# Patient Record
Sex: Female | Born: 1965 | Race: White | Hispanic: No | Marital: Married | Smoking: Never smoker
Health system: Southern US, Community
[De-identification: ages and names within clinical notes are randomized; demographics above are authoritative.]

## PROBLEM LIST (undated history)

## (undated) HISTORY — PX: OTHER SURGICAL HISTORY: SHX169

---

## 2001-08-02 ENCOUNTER — Emergency Department (HOSPITAL_COMMUNITY): Admission: EM | Admit: 2001-08-02 | Discharge: 2001-08-02 | Payer: Self-pay | Admitting: Emergency Medicine

## 2001-08-02 ENCOUNTER — Encounter: Payer: Self-pay | Admitting: Emergency Medicine

## 2008-09-27 ENCOUNTER — Ambulatory Visit (HOSPITAL_COMMUNITY): Admission: RE | Admit: 2008-09-27 | Discharge: 2008-09-27 | Payer: Self-pay | Admitting: Dermatology

## 2010-01-13 IMAGING — CR DG CHEST 2V
2 series · 2 of 2 positions shown · non-contrast
Comparison: None

CLINICAL DATA: Evaluate for sarcoid.

CHEST - 2 VIEW

[w chest pa]
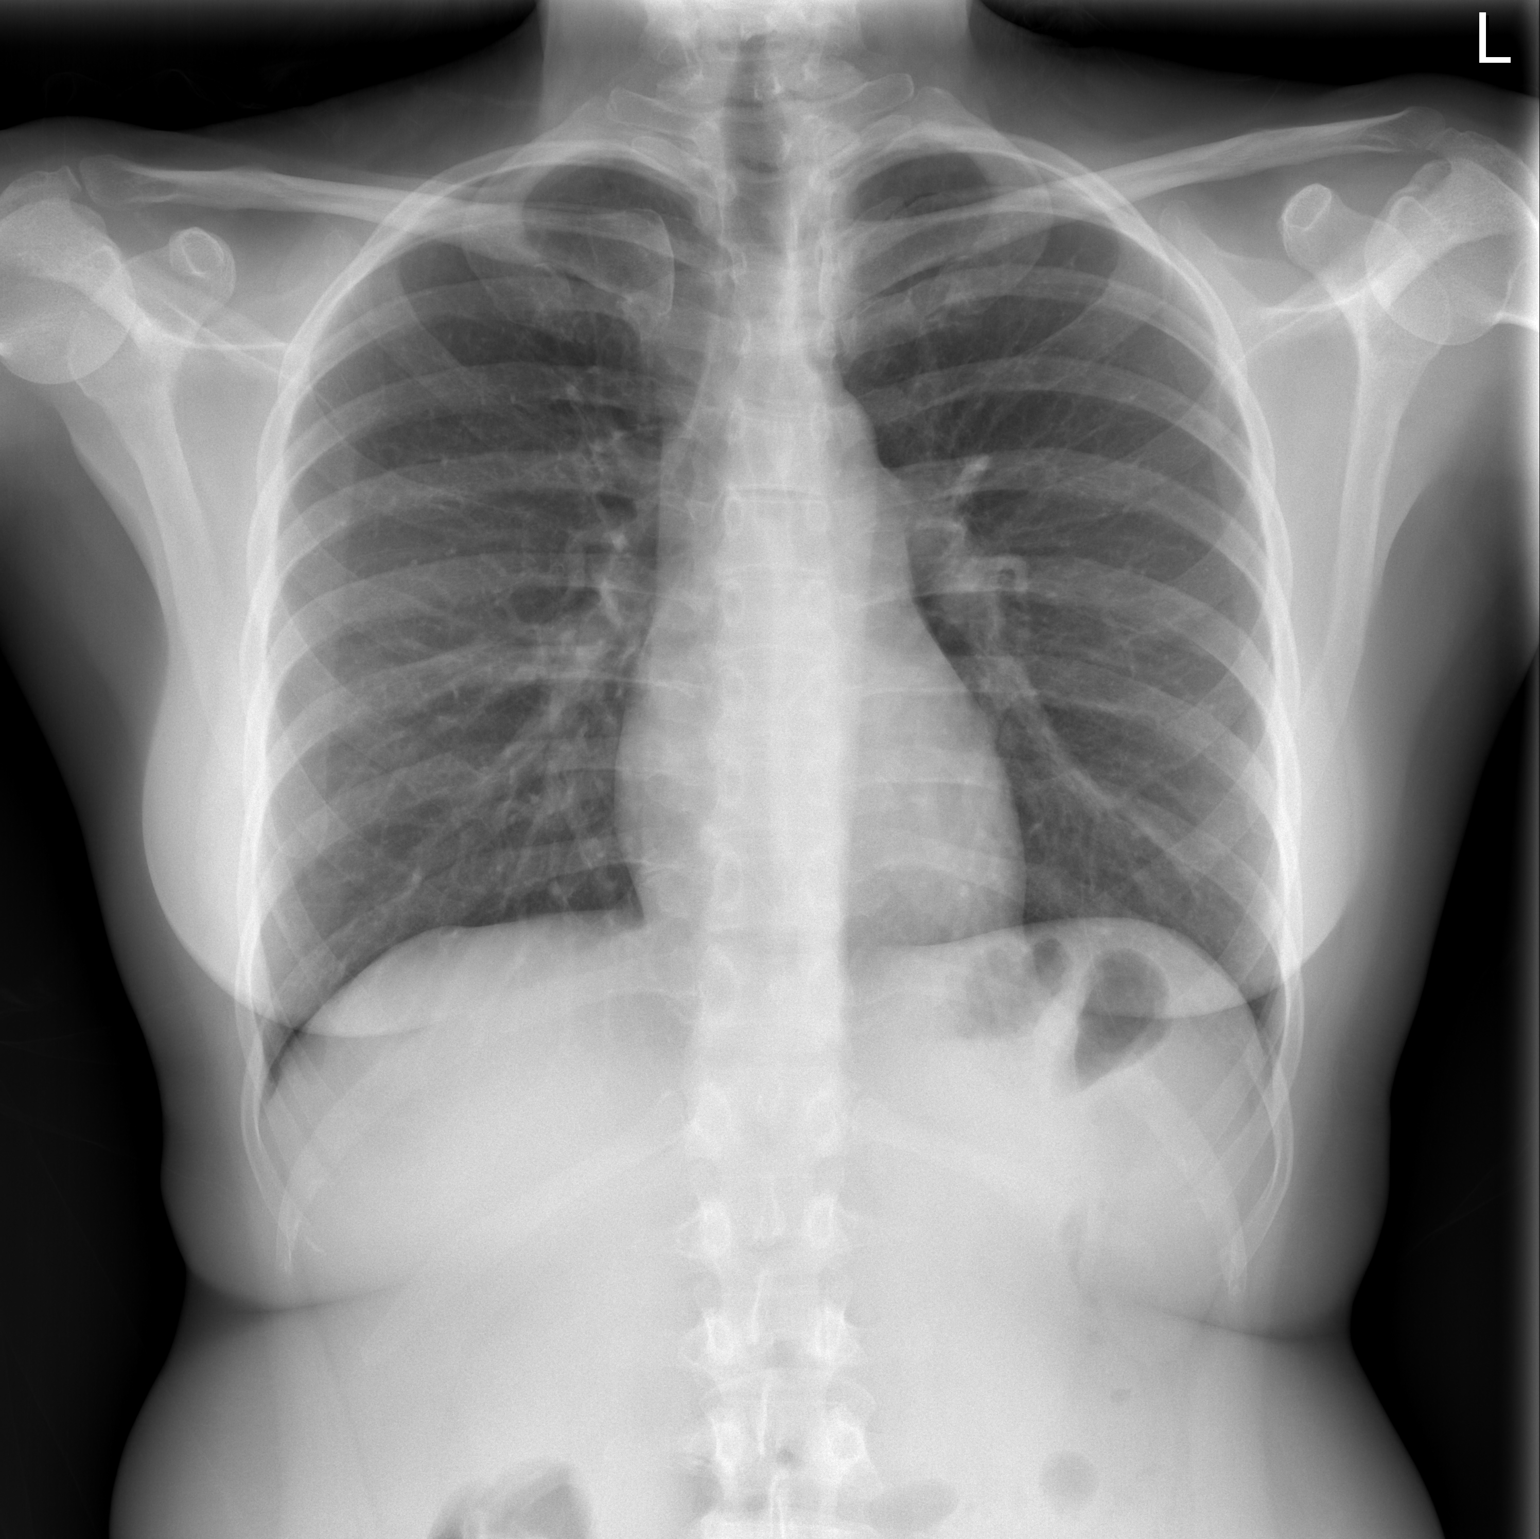

[w chest lat]
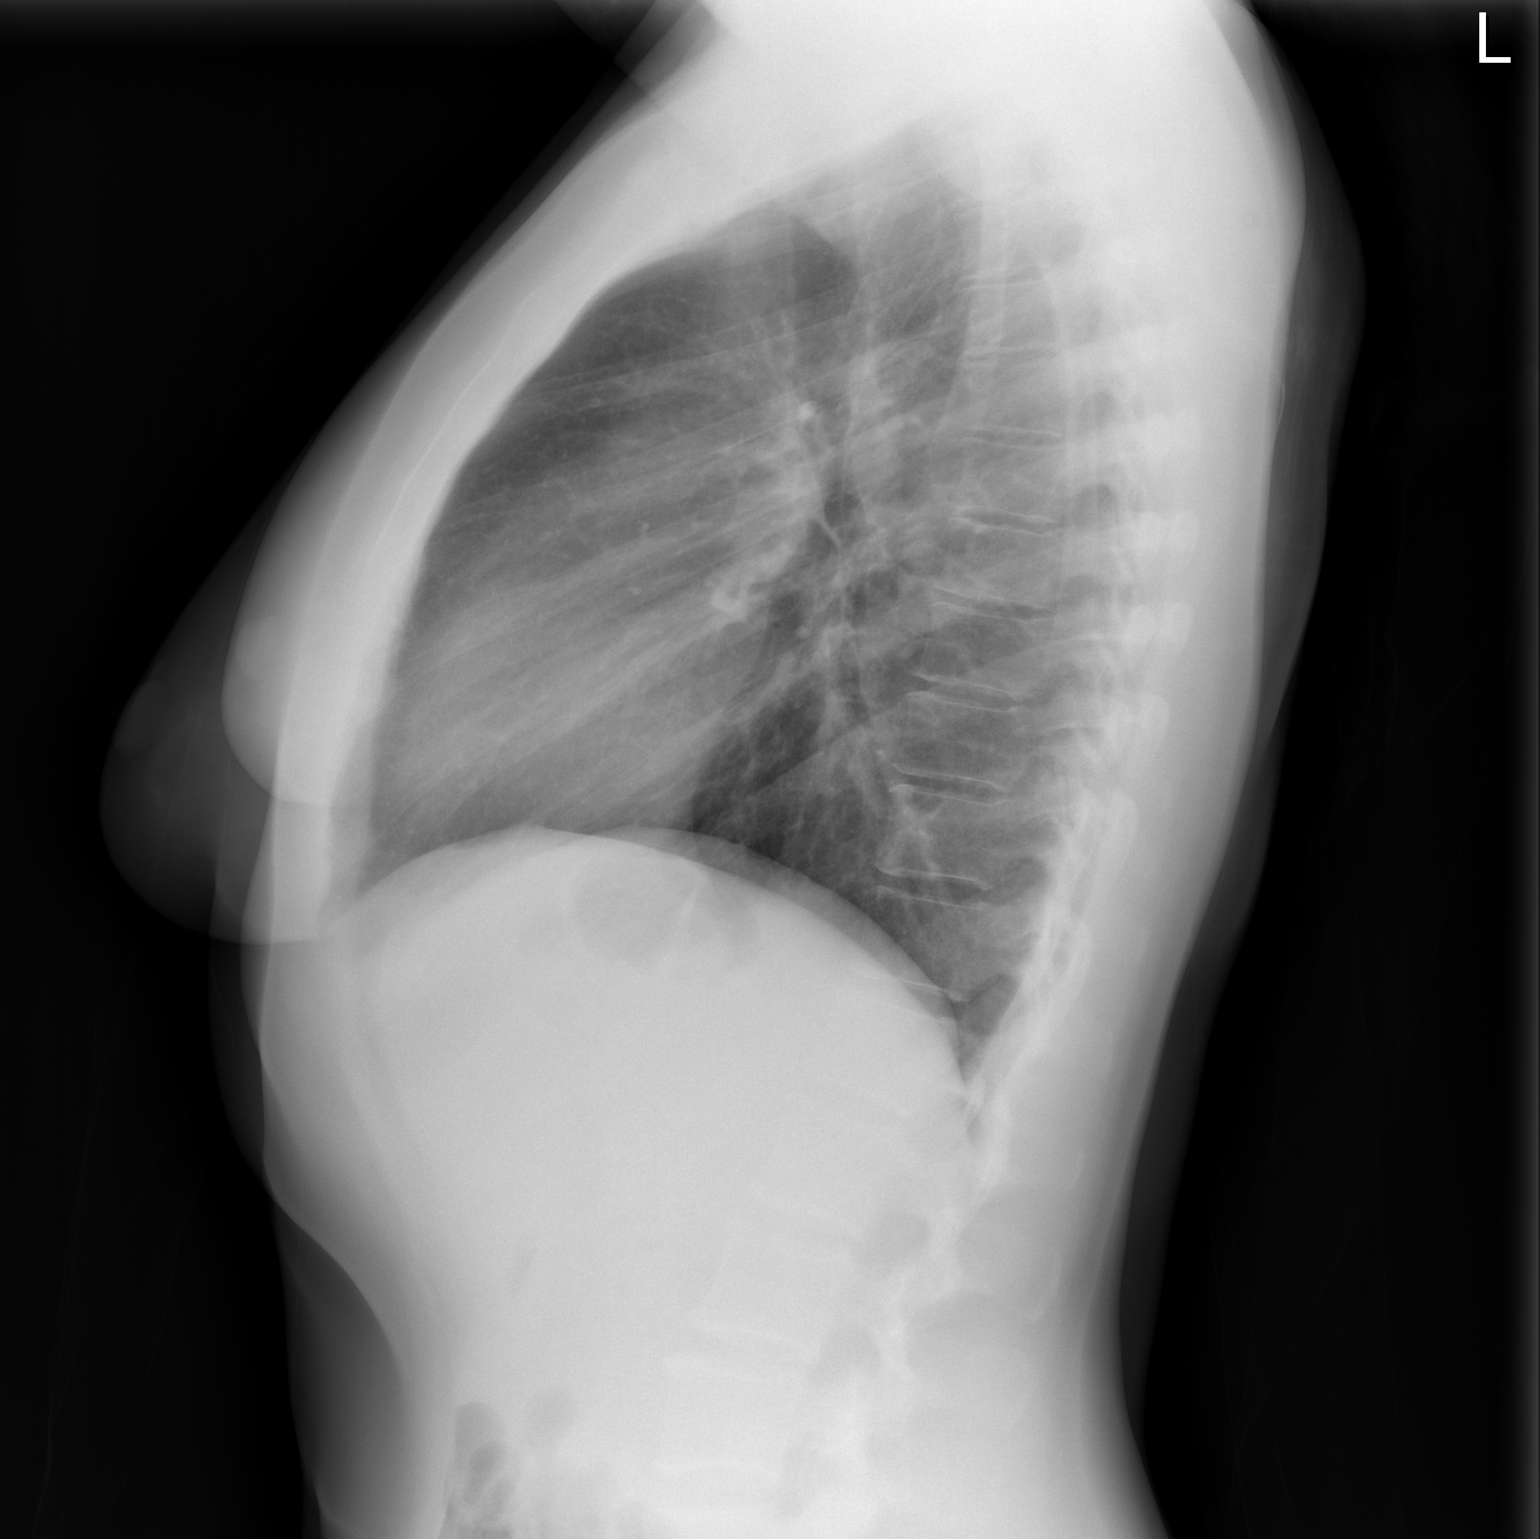

[2 of 2 positions shown; findings below may reference images not displayed]

FINDINGS: Heart and mediastinal contours are within normal limits.
No focal opacities or effusions.  No acute bony abnormality. No
evidence for mediastinal or hilar adenopathy.
IMPRESSION: No acute findings.

## 2010-09-20 NOTE — Consult Note (Signed)
[facility]  Patient:    Katrina Petersen, OFFNER Visit Number: [id number] MRN: [id number]          Service Type: EMS Location: [facility] Attending Physician:  Osvaldo Human Dictated by:   Jesse Sans Wall, M.D. [facility] Admit Date:  08/02/2001   CC:         Fayrene Fearing A. Floyce Stakes, M.D., [facility] ER  Lorelei Pont, M.D.  [facility]   Consultation Report  EMERGENCY ROOM CONSULTATION NOTE  INTRODUCTION:  I was asked by Dr. Rudy Jew. [place] in the emergency room to evaluate [facility].  HISTORY OF PRESENT ILLNESS:  Katrina Petersen is a 45 year old married white female, former ICU nurse, who comes tonight with her husband because of a four day history of fatigue, shortness of breath, and some chest discomfort with palpitations.  She denies any fever, chills, or nightsweats. She has had no hemoptysis. She has had no productive cough. She denies any nausea, vomiting, or diarrhea.  She has no previous cardiac history.  She states that the discomfort is a tight gripping sensation in the middle of her chest. It is spontaneous and does not occur with any particular activity. It does not radiate. She says that when she lays back it feels a little more uncomfortable than when she sits forward.  She called the office but could not get in until the 21st of April, so she came to the emergency room today, which I think is quite appropriate.  Her primary care physician is Lorelei Pont, M.D. in [facility] who thought she had viral endocarditis.  PAST MEDICAL HISTORY:  She is a healthy young woman otherwise. She works out on a regular basis doing aerobics. She has five children, two adopted, that she tries to keep up with!  She is on no medications.  ALLERGIES:  PENICILLIN, DOXYCYCLINE.  SOCIAL HISTORY:  She does not drink or smoke.  FAMILY HISTORY:  Hypertension and varicose veins but no premature coronary disease.  PHYSICAL EXAMINATION:  GENERAL:   Exam in the emergency room showed her to be in no acute distress. She is very pleasant.  VITAL SIGNS:  Her blood pressure is 109/69, pulse 78 and regular, respirations 18 and unlabored. O2 saturation 98% on room air. She is afebrile.  NECK:  No JVD, there is no lymphadenopathy. Carotid strokes are equal bilaterally without bruits. There is no thyromegaly. Trachea is midline without deviation.  LUNGS:  Clear breath sounds throughout. There is no thoracic bruit.  CARDIAC:  A nondisplaced PMI. She has normal S1 and S2 without murmur, click, or gallop. No diastolic murmur was heard.  ABDOMEN:  Soft with good bowel sounds. There is no hepatomegaly.  EXTREMITIES:  No cyanosis, clubbing, or edema. Pulses are full bilaterally.  NEUROLOGIC:  Grossly intact.  LABORATORY AND ACCESSORY DATA:  Normal CBC, normal BMP. Her cardiac enzymes are negative.  Chest x-ray shows a normal sized heart with no widening of the mediastinum or aorta. There is no effusions or infiltrate. There is no acute cardiopulmonary disease.  EKG is completely normal. There is no evidence of ST elevation or conduction abnormalities.  We performed a 2-D echocardiogram in the emergency room to rule out pericardial effusion or any myocardial dysfunction. Her 2-D echocardiogram shows normal left and right ventricular chamber size and function, ejection fraction is greater than 60%. Left atrium and right atrium are normal in size, there is no evidence of mitral valve prolapse, there is no valvular heart disease or evidence  of any vegetations, there is no pericardial effusion, and right-sided function and pressures appear to be normal.  ASSESSMENT: 1. Chest discomfort, palpitations, and fatigue, doubt cardiac etiology    based on the above. 2. Normal 2-D echocardiogram. 3. Normal EKG. 4. Negative blood studies.  I have asked the patient to take p.r.n. ibuprofen and rest. She is to push fluids. If she continues to  have symptoms, she is to touch base with her family physician Lorelei Pont, M.D. in [place]. Dictated by:   Jesse Sans Wall, M.D. [facility] Attending Physician:  Osvaldo Human DD:  08/02/01 TD:  08/03/01 Job: [id number]

## 2013-07-08 ENCOUNTER — Ambulatory Visit (INDEPENDENT_AMBULATORY_CARE_PROVIDER_SITE_OTHER): Payer: PRIVATE HEALTH INSURANCE

## 2013-07-08 ENCOUNTER — Telehealth: Payer: Self-pay | Admitting: *Deleted

## 2013-07-08 VITALS — BP 122/70 | HR 64 | Resp 15 | Ht 67.0 in | Wt 185.0 lb

## 2013-07-08 DIAGNOSIS — M79606 Pain in leg, unspecified: Secondary | ICD-10-CM

## 2013-07-08 DIAGNOSIS — M722 Plantar fascial fibromatosis: Secondary | ICD-10-CM

## 2013-07-08 DIAGNOSIS — M79609 Pain in unspecified limb: Secondary | ICD-10-CM

## 2013-07-08 DIAGNOSIS — M7660 Achilles tendinitis, unspecified leg: Secondary | ICD-10-CM

## 2013-07-08 DIAGNOSIS — M7730 Calcaneal spur, unspecified foot: Secondary | ICD-10-CM

## 2013-07-08 MED ORDER — MELOXICAM 15 MG PO TABS
15.0000 mg | ORAL_TABLET | Freq: Every day | ORAL | Status: DC
Start: 2013-07-08 — End: 2013-07-08

## 2013-07-08 MED ORDER — MELOXICAM 15 MG PO TABS: 15.0000 mg | ORAL_TABLET | Freq: Every day | ORAL | Status: DC

## 2013-07-08 NOTE — Telephone Encounter (Signed)
Entered in error

## 2013-07-08 NOTE — Patient Instructions (Signed)

## 2013-07-08 NOTE — Addendum Note (Signed)
Addended by: Lottie RaterPREVETTE, ASHLEY E on: 07/08/2013 01:39 PM   Modules accepted: Orders

## 2013-07-08 NOTE — Progress Notes (Signed)
   Subjective:    Patient ID: Katrina Petersen, female    DOB: 1965/10/23, 48 y.o.   MRN: [id number]  HPI N heel pain        L  Left posterior heel        D episodes        O July 2014, and hx of plantar fasciitis about 7 years ago        C pulling, burning, sharp pain         A worse after resting and in the morning        T anti-inflammatory medicine, stretches, heat and soaks Patient most significant pain is on first up in the morning or getting up for a period of rest she had an episode several years ago that resolved with plantar fasciitis however this time is having pain in the Achilles tendon but this may be secondary to compensatory gait changes since   Review of Systems  All other systems reviewed and are negative.       Objective:   Physical Exam Neurovascular status is intact pedal pulses palpable DP and PT +2/4 bilateral capillary fill time 3 seconds all digits epicritic and proprioceptive sensations intact and symmetric bilateral. Dermatologically skin color pigment and hair growth are normal. Orthopedic biomechanical exam reveals rectus foot type bilateral x-rays reveal well-developed inferior calcaneal spur no retrocalcaneal spurring may be slight pump bump deformity or Haglund noted clinically although not significantly painful on palpation or evaluation. There is definite pain tenderness on palpation medial band of plantar fascial mid calcaneal tubercle area left heel. Right foot is asymptomatic.       Assessment & Plan:  Assessment this time is plantar fasciitis/heel spur syndrome left foot with secondary Achilles tendinitis plan at this time fascial strapping is applied patient will maintain crocs or a good shoes around the house no barefoot or flimsy shoes or flip-flops. Recommended ice and [facility] is also given at this time. Recheck within 2 weeks for followup may be candidate for orthoses based on progress and improvement  Alvan Dameichard Dyshon Philbin DPM

## 2013-07-22 ENCOUNTER — Ambulatory Visit: Payer: PRIVATE HEALTH INSURANCE

## 2013-07-25 ENCOUNTER — Ambulatory Visit: Payer: PRIVATE HEALTH INSURANCE

## 2013-08-02 ENCOUNTER — Ambulatory Visit: Payer: PRIVATE HEALTH INSURANCE

## 2013-08-14 ENCOUNTER — Other Ambulatory Visit: Payer: Self-pay

## 2015-05-30 ENCOUNTER — Encounter: Payer: Self-pay | Admitting: Podiatry

## 2015-05-30 ENCOUNTER — Ambulatory Visit (INDEPENDENT_AMBULATORY_CARE_PROVIDER_SITE_OTHER): Payer: PRIVATE HEALTH INSURANCE

## 2015-05-30 ENCOUNTER — Ambulatory Visit (INDEPENDENT_AMBULATORY_CARE_PROVIDER_SITE_OTHER): Payer: PRIVATE HEALTH INSURANCE | Admitting: Podiatry

## 2015-05-30 VITALS — Resp 16

## 2015-05-30 DIAGNOSIS — M779 Enthesopathy, unspecified: Secondary | ICD-10-CM | POA: Diagnosis not present

## 2015-05-30 DIAGNOSIS — M722 Plantar fascial fibromatosis: Secondary | ICD-10-CM

## 2015-05-30 MED ORDER — TRIAMCINOLONE ACETONIDE 10 MG/ML IJ SUSP
10.0000 mg | Freq: Once | INTRAMUSCULAR | Status: AC
  Administered 2015-05-30: 10 mg

## 2015-05-30 MED ORDER — DICLOFENAC SODIUM 75 MG PO TBEC: 75.0000 mg | DELAYED_RELEASE_TABLET | Freq: Two times a day (BID) | ORAL | Status: AC

## 2015-05-30 NOTE — Patient Instructions (Signed)

## 2015-05-30 NOTE — Progress Notes (Signed)
Subjective:     Patient ID: Katrina Petersen, female   DOB: 19-Jul-1965, 50 y.o.   MRN: [id number]  HPI patient states that her right heel has been bothering a lot and it's making her walk different G developing pain on the outside of her right foot the back of her right foot and into the left ankle   Review of Systems     Objective:   Physical Exam Neurovascular status intact muscle strength adequate with exquisite discomfort plantar aspect right heel at the insertional point of the tendon the calcaneus with fluid buildup and moderate discomfort around the peroneal insertion base of fifth metatarsal right and the left ankle.     Assessment:     Acute plantar fasciitis right with inflammation fluid buildup along with probable compensatory symptoms    Plan:     H&P and all conditions reviewed and injected the right plantar fascia 3 Mill grams Kenalog 5 mg Xylocaine and applied fascial brace with instructions on physical therapy. Dispensed diclofenac and discussed long-term orthotics when we get her symptoms better and we may need to work on some of the other issues if they remain a problem

## 2015-06-06 ENCOUNTER — Encounter: Payer: Self-pay | Admitting: Podiatry

## 2015-06-06 ENCOUNTER — Ambulatory Visit (INDEPENDENT_AMBULATORY_CARE_PROVIDER_SITE_OTHER): Payer: PRIVATE HEALTH INSURANCE | Admitting: Podiatry

## 2015-06-06 VITALS — BP 100/65 | HR 72 | Resp 16

## 2015-06-06 DIAGNOSIS — M722 Plantar fascial fibromatosis: Secondary | ICD-10-CM | POA: Diagnosis not present

## 2015-06-06 NOTE — Progress Notes (Signed)
Subjective:     Patient ID: Katrina Petersen, female   DOB: May 18, 1965, 50 y.o.   MRN: [id number]  HPI patient presents stating I'm doing better but I still get occasional discomfort if him on it too long   Review of Systems     Objective:   Physical Exam  neurovascular status intact muscle strength was found to be adequate with patient having minimal discomfort upon deep palpation to the plantar fascia right but quite a bit improved from previous visits    Assessment:      improving fasciitis right    Plan:      reviewed condition and discussed continued physical therapy anti-inflammatories and supportive shoe. Oriented try to let ago and she will reappoint if symptoms persist or get worse
